# Patient Record
Sex: Male | Born: 1990 | Race: White | Hispanic: No | Marital: Single | Smoking: Never smoker
Health system: Southern US, Community
[De-identification: ages and names within clinical notes are randomized; demographics above are authoritative.]

## PROBLEM LIST (undated history)

## (undated) DIAGNOSIS — R7989 Other specified abnormal findings of blood chemistry: Secondary | ICD-10-CM

## (undated) DIAGNOSIS — F909 Attention-deficit hyperactivity disorder, unspecified type: Secondary | ICD-10-CM

## (undated) HISTORY — DX: Attention-deficit hyperactivity disorder, unspecified type: F90.9

## (undated) HISTORY — DX: Other specified abnormal findings of blood chemistry: R79.89

---
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
## 2010-06-03 ENCOUNTER — Emergency Department: Payer: Self-pay | Admitting: Emergency Medicine

## 2021-11-13 LAB — HM HEPATITIS C SCREENING LAB: HM Hepatitis Screen: NEGATIVE

## 2021-11-13 LAB — HM HIV SCREENING LAB: HM HIV Screening: NEGATIVE

## 2022-10-22 ENCOUNTER — Encounter: Payer: Self-pay | Admitting: Urology

## 2022-10-22 ENCOUNTER — Ambulatory Visit (INDEPENDENT_AMBULATORY_CARE_PROVIDER_SITE_OTHER): Payer: PRIVATE HEALTH INSURANCE | Admitting: Urology

## 2022-10-22 VITALS — BP 155/82 | HR 120 | Ht 69.0 in | Wt 228.0 lb

## 2022-10-22 DIAGNOSIS — E291 Testicular hypofunction: Secondary | ICD-10-CM

## 2022-10-22 MED ORDER — TESTOSTERONE ENANTHATE 200 MG/ML IM SOLN: 100.0000 mg | INTRAMUSCULAR | 4 refills | Status: DC

## 2022-10-22 NOTE — Patient Instructions (Signed)

## 2022-10-22 NOTE — Progress Notes (Signed)
   10/22/22 8:26 AM   Jalene D Freeze 1991/05/22 [id number]  CC: Hypogonadism, ED, transferred care from Dr. Eliberto Ivory  HPI: 32 year old male transferring his care from Dr. Eliberto Ivory for low testosterone.  He was started on testosterone about a year and a half ago by Dr. Eliberto Ivory.  Original testosterone was low at 223, and he had symptoms of fatigue, daytime sleepiness, low libido, and ED.  He has had significant improvement in the symptoms since starting testosterone injections, and most recent testosterone was 738 1 day after injection was given.  He is currently taking an injection weekly.  He does feel a little bit more rundown towards the end of the week.  He would like to continue testosterone injections.  He does not have any children, and does not desire any children in the future.  Physical Exam: BP (!) 155/82   Pulse (!) 120   Ht 5' 9"$  (1.753 m)   Wt 228 lb (103.4 kg)   BMI 33.67 kg/m    Constitutional:  Alert and oriented, No acute distress. Cardiovascular: No clubbing, cyanosis, or edema. Respiratory: Normal respiratory effort, no increased work of breathing. GI: Abdomen is soft, nontender, nondistended, no abdominal masses  Assessment & Plan:   32 year old male with history of low testosterone currently on weekly injections with significant symptom relief, normal testosterone levels.  I reviewed the outside records from Dr. Eliberto Ivory extensively, PSA, H/H, testosterone levels have been normal.  We reviewed the AUA guidelines regarding evaluation and management of patients with low testosterone, as well as the impact on fertility, and reviewed alternative treatment options.  He would like to continue testosterone injections.  Testosterone injections refilled RTC 6 months testosterone, H/H(labs should be drawn 3 days after injection)-> if labs stable can likely move to yearly follow-up with labs every 6 months  Nickolas Madrid, MD 10/22/2022  [facility] [address], [place], [place] [address] [phone]

## 2023-04-18 ENCOUNTER — Other Ambulatory Visit
Admission: RE | Admit: 2023-04-18 | Discharge: 2023-04-18 | Disposition: A | Discharge status: Home / Self Care | Payer: PRIVATE HEALTH INSURANCE | Attending: Urology | Admitting: Urology

## 2023-04-18 DIAGNOSIS — E291 Testicular hypofunction: Secondary | ICD-10-CM | POA: Insufficient documentation

## 2023-04-18 LAB — HEMOGLOBIN AND HEMATOCRIT, BLOOD
HCT: 49 % (ref 39.0–52.0)
Hemoglobin: 17.7 g/dL — ABNORMAL HIGH (ref 13.0–17.0)

## 2023-04-20 LAB — TESTOSTERONE: Testosterone: 1136 ng/dL — ABNORMAL HIGH (ref 264–916)

## 2023-04-22 ENCOUNTER — Other Ambulatory Visit: Payer: Self-pay | Admitting: Urology

## 2023-04-22 ENCOUNTER — Ambulatory Visit (INDEPENDENT_AMBULATORY_CARE_PROVIDER_SITE_OTHER): Payer: PRIVATE HEALTH INSURANCE | Admitting: Urology

## 2023-04-22 ENCOUNTER — Encounter: Payer: Self-pay | Admitting: Urology

## 2023-04-22 VITALS — Ht 69.0 in | Wt 236.0 lb

## 2023-04-22 DIAGNOSIS — E291 Testicular hypofunction: Secondary | ICD-10-CM

## 2023-04-22 MED ORDER — TESTOSTERONE ENANTHATE 200 MG/ML IM SOLN: 100.0000 mg | INTRAMUSCULAR | 2 refills | Status: DC

## 2023-04-22 NOTE — Progress Notes (Signed)
   04/22/2023 10:09 AM   Jared [name] 08-25-1991 [id number]  Reason for visit: Follow up hypogonadism, ED  HPI: Healthy 32 year old male who transferred his care and February 2024 from Dr. Evelene Croon.  He has been on testosterone since 2022 with injections.  Original testosterone was 223, and symptoms were fatigue, daytime sleepiness, low libido, and ED.  He has had significant improvement on injections 100 mg weekly.  He has no children and does not desire any biologic pregnancies.  He denies any problems since our last visit.  Recent testosterone 04/18/2023 was 1136, however this was checked the morning after an injection.  I recommended checking his levels 3 to 4 days after an injection in the future.  Hematocrit normal at 49.  We reviewed the AUA guidelines regarding the risks and benefits of testosterone therapy, I recommended continuing the 100 mg weekly dose, will continue to monitor lab work every 6 months, and see him in person yearly for follow-up.  Lab visit 6 months with testosterone, H/H(labs should be drawn 3 to 4 days after injection) Testosterone refilled If labs stable, can continue lab work every 6 months, with PA follow-up yearly     [facility] [address], [address] [place], [place] [address] [phone]

## 2023-05-30 ENCOUNTER — Ambulatory Visit: Payer: PRIVATE HEALTH INSURANCE | Admitting: Physician Assistant

## 2023-07-15 ENCOUNTER — Ambulatory Visit (INDEPENDENT_AMBULATORY_CARE_PROVIDER_SITE_OTHER): Payer: PRIVATE HEALTH INSURANCE | Admitting: Physician Assistant

## 2023-07-15 ENCOUNTER — Encounter: Payer: Self-pay | Admitting: Physician Assistant

## 2023-07-15 VITALS — BP 120/80 | HR 86 | Temp 98.3°F | Ht 69.0 in | Wt 231.0 lb

## 2023-07-15 DIAGNOSIS — J069 Acute upper respiratory infection, unspecified: Secondary | ICD-10-CM

## 2023-07-15 DIAGNOSIS — Z6834 Body mass index (BMI) 34.0-34.9, adult: Secondary | ICD-10-CM | POA: Diagnosis not present

## 2023-07-15 DIAGNOSIS — E66812 Obesity, class 2: Secondary | ICD-10-CM | POA: Insufficient documentation

## 2023-07-15 DIAGNOSIS — F909 Attention-deficit hyperactivity disorder, unspecified type: Secondary | ICD-10-CM | POA: Diagnosis not present

## 2023-07-15 DIAGNOSIS — E291 Testicular hypofunction: Secondary | ICD-10-CM | POA: Insufficient documentation

## 2023-07-15 DIAGNOSIS — E785 Hyperlipidemia, unspecified: Secondary | ICD-10-CM | POA: Insufficient documentation

## 2023-07-15 DIAGNOSIS — Z862 Personal history of diseases of the blood and blood-forming organs and certain disorders involving the immune mechanism: Secondary | ICD-10-CM | POA: Insufficient documentation

## 2023-07-15 MED ORDER — AMPHETAMINE-DEXTROAMPHETAMINE 10 MG PO TABS
10.0000 mg | ORAL_TABLET | Freq: Two times a day (BID) | ORAL | 0 refills | Status: DC
Start: 2023-07-15 — End: 2023-08-13

## 2023-07-15 NOTE — Progress Notes (Addendum)
 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  He brings me records from Dr. Patrecia Pace with last visit 04/04/2023.  Also mentions URI symptoms for the last 1-2 days including cough and fever.  Does not particularly wish to be evaluated for this problem, figures it is likely viral.  Endorses ADHD which is substantiated by previous records.  Intolerant to [employer], but Adderall works well.  Previous dose was 10 mg twice daily and patient requesting refill at this dose.  Mentions excess weight, attributes this to his strong appetite.  He is carefully followed by urologist Dr. Richardo Hanks for TRT.  Last testosterone 04/18/2023 was 1136, but this was very soon after his injection.  Very mildly elevated hemoglobin at 17.7.  Medication list has been reviewed and updated.  Current Meds  Medication Sig   amphetamine-dextroamphetamine (ADDERALL) 10 MG tablet Take 1 tablet (10 mg total) by mouth 2 (two) times daily.   testosterone enanthate (DELATESTRYL) 200 MG/ML injection Inject 0.5 mLs (100 mg total) into the muscle once a week.     Review of Systems  Constitutional:  Positive for diaphoresis and fever.  Respiratory:  Positive for cough.     Patient Active Problem List   Diagnosis Date Noted   Attention deficit hyperactivity disorder (ADHD) 07/15/2023   Class 2 obesity 07/15/2023   History of iron deficiency anemia 07/15/2023   Hyperlipidemia 07/15/2023    Allergies  Allergen Reactions   Strattera [Atomoxetine] Rash    Immunization History  Administered Date(s) Administered   DTaP 09/25/1990, 01/20/1991, 03/29/1991, 01/05/1992, 01/16/1996   HIB,  Unspecified 09/25/1990, 01/20/1991, 03/29/1991, 10/08/1991   Hepatitis A, Adult 05/12/2008, 06/09/2008   Hepatitis B, ADULT 06/15/2002, 07/20/2002, 12/31/2002   IPV 09/25/1990, 01/20/1991, 01/05/1992, 01/16/1996   Influenza, Seasonal, Injecte, Preservative Fre 06/09/2008   Influenza,inj,Quad PF,6+ Mos 06/12/2023   MMR 10/08/1991, 01/16/1996   Meningococcal Conjugate 12/31/2007   Tdap 05/12/2008    History reviewed. No pertinent surgical history.  Social History   Tobacco Use   Smoking status: Never    Passive exposure: Never   Smokeless tobacco: Never  Vaping Use   Vaping status: Never Used  Substance Use Topics   Alcohol use: Yes    Comment: socially   Drug use: Never    Family History  Problem Relation Age of Onset   Hypertension Mother    Hypertension Father         07/15/2023   10:20 AM  GAD 7 : Generalized Anxiety Score  Nervous, Anxious, on Edge 1  Control/stop worrying 2  Worry too much - different things 1  Trouble relaxing 0  Restless 2  Easily annoyed or irritable 0  Afraid - awful might happen 0  Total GAD 7 Score 6  Anxiety Difficulty Not difficult at all       07/15/2023   10:20 AM  Depression screen PHQ 2/9  Decreased Interest 3  Down, Depressed, Hopeless 1  PHQ - 2 Score 4  Altered sleeping 0  Tired, decreased energy 0  Change in appetite 3  Feeling bad or failure about yourself  2  Trouble concentrating 3  Moving slowly or fidgety/restless 0  Suicidal thoughts 0  PHQ-9 Score 12  Difficult doing work/chores Somewhat difficult    BP Readings from Last 3 Encounters:  07/15/23 120/80  10/22/22 (!) 155/82    Wt Readings from Last 3 Encounters:  07/15/23 231 lb (104.8 kg)  04/22/23 236 lb (107 kg)  10/22/22 228 lb (103.4 kg)    BP 120/80 (BP Location: Left Arm, Patient Position: Sitting, Cuff Size: Large)   Pulse 86   Temp 98.3 F (36.8 C) (Oral)   Ht 5\' 9"  (1.753 m)   Wt 231 lb (104.8 kg)   SpO2 97%   BMI 34.11 kg/m    Physical Exam Vitals and nursing note reviewed.  Constitutional:      Appearance: Normal appearance. He is diaphoretic.  Cardiovascular:     Rate and Rhythm: Normal rate and regular rhythm.     Heart sounds: No murmur heard.    No friction rub. No gallop.  Pulmonary:     Effort: Pulmonary effort is normal.     Breath sounds: Normal breath sounds.  Abdominal:     General: There is no distension.  Musculoskeletal:        General: Normal range of motion.  Skin:    General: Skin is warm.  Neurological:     Mental Status: He is alert and oriented to person, place, and time.     Gait: Gait is intact.  Psychiatric:        Mood and Affect: Mood and affect normal.     Recent Labs  No results found for: "NA", "K", "CL", "CO2", "GLUCOSE", "BUN", "CREATININE", "CALCIUM", "PROT", "ALBUMIN", "AST", "ALT", "ALKPHOS", "BILITOT", "GFRNONAA", "GFRAA"  Lab Results  Component Value Date   HGB 17.7 (H) 04/18/2023   HCT 49.0 04/18/2023   No results found for: "HGBA1C" No results found for: "CHOL", "HDL", "LDLCALC", "LDLDIRECT", "TRIG", "CHOLHDL" No results found for: "TSH"   Assessment and Plan:  1. Attention deficit hyperactivity disorder (ADHD), unspecified ADHD type Refill Adderall as below.  Advised to watch for tachycardia, palpitations, chest pain, insomnia. - amphetamine-dextroamphetamine (ADDERALL) 10 MG tablet; Take 1 tablet (10 mg total) by mouth 2 (two) times daily.  Dispense: 60 tablet; Refill: 0  2. Acute URI Likely viral etiology. Discussed self-limited nature of viral illnesses and advised conservative measures including rest, fluids, honey, and OTC cough/cold medications. Reviewed anecdotal evidence for zinc lozenges to reduce viral replication when used at the onset of symptoms.   Contact precautions advised to limit spread. Encouraged mask wearing and good hand hygiene especially before meals. Call if acutely worsening symptoms or if no improvement after Day 7 of  illness  3. Class 2 obesity Only briefly discussed today.  Use of Adderall might provide him with some appetite suppression and produce modest weight loss.  Will follow clinically.     Return in about 3 months (around 10/15/2023) for CPE, adderall, tdap.    Alvester Morin, PA-C, DMSc, Nutritionist [facility] [phone]

## 2023-07-15 NOTE — Patient Instructions (Signed)
-

## 2023-08-08 DIAGNOSIS — E291 Testicular hypofunction: Secondary | ICD-10-CM

## 2023-08-08 MED ORDER — "BD LUER-LOK SYRINGE 23G X 1-1/2"" 3 ML MISC": 0 refills | Status: DC

## 2023-08-08 MED ORDER — "BD SAFETYGLIDE NEEDLE 18G X 1-1/2"" MISC": 0 refills | Status: DC

## 2023-08-13 ENCOUNTER — Other Ambulatory Visit: Payer: Self-pay | Admitting: Physician Assistant

## 2023-08-13 DIAGNOSIS — F909 Attention-deficit hyperactivity disorder, unspecified type: Secondary | ICD-10-CM

## 2023-08-13 MED ORDER — AMPHETAMINE-DEXTROAMPHETAMINE 10 MG PO TABS: 10.0000 mg | ORAL_TABLET | Freq: Two times a day (BID) | ORAL | 0 refills | Status: DC

## 2023-09-15 ENCOUNTER — Other Ambulatory Visit: Payer: Self-pay | Admitting: Physician Assistant

## 2023-09-15 DIAGNOSIS — F909 Attention-deficit hyperactivity disorder, unspecified type: Secondary | ICD-10-CM

## 2023-09-15 MED ORDER — AMPHETAMINE-DEXTROAMPHETAMINE 10 MG PO TABS: 10.0000 mg | ORAL_TABLET | Freq: Two times a day (BID) | ORAL | 0 refills | Status: DC

## 2023-10-15 ENCOUNTER — Other Ambulatory Visit: Payer: Self-pay | Admitting: Physician Assistant

## 2023-10-15 DIAGNOSIS — F909 Attention-deficit hyperactivity disorder, unspecified type: Secondary | ICD-10-CM

## 2023-10-15 MED ORDER — AMPHETAMINE-DEXTROAMPHETAMINE 10 MG PO TABS: 10.0000 mg | ORAL_TABLET | Freq: Two times a day (BID) | ORAL | 0 refills | Status: DC

## 2023-10-16 ENCOUNTER — Encounter: Payer: PRIVATE HEALTH INSURANCE | Admitting: Family Medicine

## 2023-10-16 ENCOUNTER — Encounter: Payer: Self-pay | Admitting: Physician Assistant

## 2023-10-20 ENCOUNTER — Encounter: Payer: PRIVATE HEALTH INSURANCE | Admitting: Physician Assistant

## 2023-10-31 ENCOUNTER — Ambulatory Visit (INDEPENDENT_AMBULATORY_CARE_PROVIDER_SITE_OTHER): Payer: PRIVATE HEALTH INSURANCE | Admitting: Physician Assistant

## 2023-10-31 ENCOUNTER — Encounter: Payer: Self-pay | Admitting: Physician Assistant

## 2023-10-31 VITALS — BP 114/78 | HR 94 | Temp 98.2°F | Ht 69.0 in | Wt 226.0 lb

## 2023-10-31 DIAGNOSIS — Z0000 Encounter for general adult medical examination without abnormal findings: Secondary | ICD-10-CM

## 2023-10-31 DIAGNOSIS — Z79899 Other long term (current) drug therapy: Secondary | ICD-10-CM | POA: Diagnosis not present

## 2023-10-31 DIAGNOSIS — I861 Scrotal varices: Secondary | ICD-10-CM

## 2023-10-31 DIAGNOSIS — E291 Testicular hypofunction: Secondary | ICD-10-CM

## 2023-10-31 DIAGNOSIS — E785 Hyperlipidemia, unspecified: Secondary | ICD-10-CM

## 2023-10-31 DIAGNOSIS — Z23 Encounter for immunization: Secondary | ICD-10-CM

## 2023-10-31 DIAGNOSIS — F909 Attention-deficit hyperactivity disorder, unspecified type: Secondary | ICD-10-CM

## 2023-10-31 NOTE — Progress Notes (Signed)
 Date:  10/31/2023   Name:  Jared [name]   DOB:  06/15/1991   MRN:  [id number]   Chief Complaint: Annual Exam  HPI Jared [name] presents today for routine physical exam.  Last Physical: >5y ago Last Dental Exam: 34m ago Last Eye Exam: 1y ago Immunizations Due: Tdap  Thinks he might have OSA for about 1 year. Snores loudly, witnessed apneic events per fiancee.  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   Medication list has been reviewed and updated.  Current Meds  Medication Sig   amphetamine-dextroamphetamine (ADDERALL) 10 MG tablet Take 1 tablet (10 mg total) by mouth 2 (two) times daily.   NEEDLE, DISP, 18 G (BD SAFETYGLIDE NEEDLE) 18G X 1-1/2" MISC Use as directed   SYRINGE-NEEDLE, DISP, 3 ML (B-D 3CC LUER-LOK SYR 23GX1-1/2) 23G X 1-1/2" 3 ML MISC Use as directed   testosterone enanthate (DELATESTRYL) 200 MG/ML injection Inject 0.5 mLs (100 mg total) into the muscle once a week.     Review of Systems  Patient Active Problem List   Diagnosis Date Noted   Long-term current use of stimulant 10/31/2023   Varicocele 10/31/2023   Attention deficit hyperactivity disorder (ADHD) 07/15/2023   Class 2 obesity 07/15/2023   History of iron deficiency anemia 07/15/2023   Hyperlipidemia 07/15/2023   Hypogonadism in male 07/15/2023    Allergies  Allergen Reactions   Strattera [Atomoxetine] Rash    Immunization History  Administered Date(s) Administered   DTaP 09/25/1990, 01/20/1991, 03/29/1991, 01/05/1992, 01/16/1996   HIB, Unspecified 09/25/1990, 01/20/1991, 03/29/1991, 10/08/1991   Hepatitis A, Adult 05/12/2008, 06/09/2008   Hepatitis B, ADULT 06/15/2002, 07/20/2002, 12/31/2002   IPV 09/25/1990, 01/20/1991, 01/05/1992, 01/16/1996   Influenza, Seasonal, Injecte, Preservative Fre 06/09/2008   Influenza,inj,Quad PF,6+ Mos 06/12/2023   MMR 10/08/1991, 01/16/1996    Meningococcal Conjugate 12/31/2007   Tdap 05/12/2008, 10/31/2023    History reviewed. No pertinent surgical history.  Social History   Tobacco Use   Smoking status: Never    Passive exposure: Never   Smokeless tobacco: Never  Vaping Use   Vaping status: Never Used  Substance Use Topics   Alcohol use: Yes    Comment: socially   Drug use: Never    Family History  Problem Relation Age of Onset   Hypertension Mother    Hypertension Father         07/15/2023   10:20 AM  GAD 7 : Generalized Anxiety Score  Nervous, Anxious, on Edge 1  Control/stop worrying 2  Worry too much - different things 1  Trouble relaxing 0  Restless 2  Easily annoyed or irritable 0  Afraid - awful might happen 0  Total GAD 7 Score 6  Anxiety Difficulty Not difficult at all       07/15/2023   10:20 AM  Depression screen PHQ 2/9  Decreased Interest 3  Down, Depressed, Hopeless 1  PHQ - 2 Score 4  Altered sleeping 0  Tired, decreased energy 0  Change in appetite 3  Feeling bad or failure about yourself  2  Trouble concentrating 3  Moving slowly or fidgety/restless 0  Suicidal thoughts 0  PHQ-9 Score 12  Difficult doing work/chores Somewhat difficult    BP Readings from Last 3 Encounters:  10/31/23 114/78  07/15/23 120/80  10/22/22 (!) 155/82    Wt Readings from Last 3 Encounters:  10/31/23 226  lb (102.5 kg)  07/15/23 231 lb (104.8 kg)  04/22/23 236 lb (107 kg)    BP 114/78   Pulse 94   Temp 98.2 F (36.8 C)   Ht 5\' 9"  (1.753 m)   Wt 226 lb (102.5 kg)   SpO2 96%   BMI 33.37 kg/m   Physical Exam Vitals and nursing note reviewed.  Constitutional:      Appearance: Normal appearance.  HENT:     Ears:     Comments: EAC clear bilaterally with good view of TM which is without effusion or erythema.     Nose: Nose normal.     Mouth/Throat:     Mouth: Mucous membranes are moist. No oral lesions.     Dentition: Normal dentition.     Pharynx: No posterior oropharyngeal  erythema.  Eyes:     Extraocular Movements: Extraocular movements intact.     Conjunctiva/sclera: Conjunctivae normal.     Pupils: Pupils are equal, round, and reactive to light.  Neck:     Thyroid: No thyromegaly.  Cardiovascular:     Rate and Rhythm: Normal rate and regular rhythm.     Heart sounds: No murmur heard.    No friction rub. No gallop.     Comments: Pulses 2+ at radial, PT, DP bilaterally. No carotid bruit. No peripheral edema Pulmonary:     Effort: Pulmonary effort is normal.     Breath sounds: Normal breath sounds.  Abdominal:     General: Bowel sounds are normal.     Palpations: Abdomen is soft. There is no mass.     Tenderness: There is no abdominal tenderness.  Genitourinary:    Comments: Typical male genitalia with varicocele noted, probably bilateral.  There also appears to be some degree of testicular atrophy. Reviewed technique for testicular self-exam at home.  Musculoskeletal:     Comments: Full ROM with strength 5/5 bilateral upper and lower extremities  Lymphadenopathy:     Cervical: No cervical adenopathy.  Skin:    General: Skin is warm.     Capillary Refill: Capillary refill takes less than 2 seconds.     Findings: No lesion or rash.  Neurological:     Mental Status: He is alert and oriented to person, place, and time.     Gait: Gait is intact.  Psychiatric:        Mood and Affect: Mood normal.        Behavior: Behavior normal.     Recent Labs  No results found for: "NA", "K", "CL", "CO2", "GLUCOSE", "BUN", "CREATININE", "CALCIUM", "PROT", "ALBUMIN", "AST", "ALT", "ALKPHOS", "BILITOT", "GFRNONAA", "GFRAA"  Lab Results  Component Value Date   HGB 17.7 (H) 04/18/2023   HCT 49.0 04/18/2023   No results found for: "HGBA1C" No results found for: "CHOL", "HDL", "LDLCALC", "LDLDIRECT", "TRIG", "CHOLHDL" No results found for: "TSH"   Assessment and Plan:  1. Annual physical exam (Primary) Seemingly healthy patient with no abnormalities on  exam. Encouraged healthy lifestyle including regular physical activity and consumption of whole fruits and vegetables. Encouraged routine dental and eye exams. Vaccinations up to date.  Ordering routine labs today.  Patient also has some outstanding labs ordered by Dr. Richardo Hanks including testosterone; for this reason, he will refer return at a later date for fasting early morning labs.  - Lipid panel - CBC with Differential/Platelet - Comprehensive metabolic panel - TSH  2. Immunization due Tdap booster administered today - Tdap vaccine greater than or equal to 7yo IM  3.  Attention deficit hyperactivity disorder (ADHD), unspecified ADHD type Due for UDS.  Patient to request refill for Adderall in 7-10d - Drug Screen, Urine  4. Long-term current use of stimulant - Drug Screen, Urine  5. Hypogonadism in male Plan to check testosterone soon, per Dr. Sonia Side orders.  Continue IM testosterone 100 mg weekly as prescribed.  6. Varicocele Asymptomatic, incidental finding on today's exam.  We reviewed signs and symptoms of symptomatic varicocele.  Question if this is the cause of his hypogonadism.  Probably some degree of testicular atrophy as well, but again cannot be sure if this is caused by varicocele or exogenous testosterone therapy.  He has no interest in fertility.  Patient has upcoming visit with urology, encouraged he seek their guidance as well.  7. Hyperlipidemia, unspecified hyperlipidemia type Will check fasting lipids when he returns for labs. - Lipid panel     Return in about 3 months (around 01/31/2024) for OV MyChart f/u chronic conditions.    Alvester Morin, PA-C, DMSc, Nutritionist [facility] [phone]

## 2023-11-02 LAB — DRUG SCREEN, URINE
Amphetamines, Urine: NEGATIVE ng/mL
Barbiturate screen, urine: NEGATIVE ng/mL
Benzodiazepine Quant, Ur: NEGATIVE ng/mL
Cannabinoid Quant, Ur: NEGATIVE ng/mL
Cocaine (Metab.): NEGATIVE ng/mL
Opiate Quant, Ur: NEGATIVE ng/mL
PCP Quant, Ur: NEGATIVE ng/mL

## 2023-11-17 ENCOUNTER — Other Ambulatory Visit
Admission: RE | Admit: 2023-11-17 | Discharge: 2023-11-17 | Disposition: A | Discharge status: Home / Self Care | Payer: PRIVATE HEALTH INSURANCE | Attending: Urology | Admitting: Urology

## 2023-11-17 ENCOUNTER — Other Ambulatory Visit: Payer: Self-pay | Admitting: Physician Assistant

## 2023-11-17 ENCOUNTER — Telehealth: Payer: Self-pay | Admitting: Urology

## 2023-11-17 DIAGNOSIS — F909 Attention-deficit hyperactivity disorder, unspecified type: Secondary | ICD-10-CM

## 2023-11-17 DIAGNOSIS — E291 Testicular hypofunction: Secondary | ICD-10-CM | POA: Insufficient documentation

## 2023-11-17 LAB — HEMOGLOBIN AND HEMATOCRIT, BLOOD
HCT: 50.4 % (ref 39.0–52.0)
Hemoglobin: 18.4 g/dL — ABNORMAL HIGH (ref 13.0–17.0)

## 2023-11-17 MED ORDER — AMPHETAMINE-DEXTROAMPHETAMINE 10 MG PO TABS: 10.0000 mg | ORAL_TABLET | Freq: Two times a day (BID) | ORAL | 0 refills | Status: DC

## 2023-11-17 NOTE — Telephone Encounter (Signed)
 Pt had labs drawn today in [place].    Lab visit 6 months with testosterone, H/H(labs should be drawn 3 to 4 days after injection) Testosterone refilled If labs stable, can continue lab work every 6 months, with PA follow-up yearly  This was last message from [facility] 03/2023.  I told pt we would call with lab results and whether he needed a yearly follow up w/PA.

## 2023-11-18 ENCOUNTER — Other Ambulatory Visit: Payer: Self-pay

## 2023-11-18 DIAGNOSIS — E291 Testicular hypofunction: Secondary | ICD-10-CM

## 2023-11-18 LAB — LIPID PANEL
Chol/HDL Ratio: 5 ratio (ref 0.0–5.0)
Cholesterol, Total: 170 mg/dL (ref 100–199)
HDL: 34 mg/dL — ABNORMAL LOW (ref >39)
LDL Chol Calc (NIH): 91 mg/dL (ref 0–99)
Triglycerides: 270 mg/dL — ABNORMAL HIGH (ref 0–149)
VLDL Cholesterol Cal: 45 mg/dL — ABNORMAL HIGH (ref 5–40)

## 2023-11-18 LAB — CBC WITH DIFFERENTIAL/PLATELET
Basophils Absolute: 0 10*3/uL (ref 0.0–0.2)
Basos: 0 %
EOS (ABSOLUTE): 0.2 10*3/uL (ref 0.0–0.4)
Eos: 2 %
Hematocrit: 53.3 % — ABNORMAL HIGH (ref 37.5–51.0)
Hemoglobin: 18.2 g/dL — ABNORMAL HIGH (ref 13.0–17.7)
Immature Grans (Abs): 0 10*3/uL (ref 0.0–0.1)
Immature Granulocytes: 0 %
Lymphocytes Absolute: 2.5 10*3/uL (ref 0.7–3.1)
Lymphs: 29 %
MCH: 31.2 pg (ref 26.6–33.0)
MCHC: 34.1 g/dL (ref 31.5–35.7)
MCV: 91 fL (ref 79–97)
Monocytes Absolute: 0.7 10*3/uL (ref 0.1–0.9)
Monocytes: 8 %
Neutrophils Absolute: 5.1 10*3/uL (ref 1.4–7.0)
Neutrophils: 61 %
Platelets: 239 10*3/uL (ref 150–450)
RBC: 5.83 x10E6/uL — ABNORMAL HIGH (ref 4.14–5.80)
RDW: 13 % (ref 11.6–15.4)
WBC: 8.4 10*3/uL (ref 3.4–10.8)

## 2023-11-18 LAB — COMPREHENSIVE METABOLIC PANEL
ALT: 51 IU/L — ABNORMAL HIGH (ref 0–44)
AST: 33 IU/L (ref 0–40)
Albumin: 4.7 g/dL (ref 4.1–5.1)
Alkaline Phosphatase: 60 IU/L (ref 44–121)
BUN/Creatinine Ratio: 8 — ABNORMAL LOW (ref 9–20)
BUN: 9 mg/dL (ref 6–20)
Bilirubin Total: 0.6 mg/dL (ref 0.0–1.2)
CO2: 21 mmol/L (ref 20–29)
Calcium: 9.6 mg/dL (ref 8.7–10.2)
Chloride: 101 mmol/L (ref 96–106)
Creatinine, Ser: 1.09 mg/dL (ref 0.76–1.27)
Globulin, Total: 2.2 g/dL (ref 1.5–4.5)
Glucose: 74 mg/dL (ref 70–99)
Potassium: 4.3 mmol/L (ref 3.5–5.2)
Sodium: 140 mmol/L (ref 134–144)
Total Protein: 6.9 g/dL (ref 6.0–8.5)
eGFR: 92 mL/min/{1.73_m2} (ref >59)

## 2023-11-18 LAB — TESTOSTERONE: Testosterone: 376 ng/dL (ref 264–916)

## 2023-11-18 LAB — TSH: TSH: 1.34 u[IU]/mL (ref 0.450–4.500)

## 2023-11-18 MED ORDER — TESTOSTERONE ENANTHATE 200 MG/ML IM SOLN: 100.0000 mg | INTRAMUSCULAR | 2 refills | Status: AC

## 2023-12-18 ENCOUNTER — Other Ambulatory Visit: Payer: Self-pay | Admitting: Physician Assistant

## 2023-12-18 DIAGNOSIS — F909 Attention-deficit hyperactivity disorder, unspecified type: Secondary | ICD-10-CM

## 2023-12-18 MED ORDER — AMPHETAMINE-DEXTROAMPHETAMINE 10 MG PO TABS: 10.0000 mg | ORAL_TABLET | Freq: Two times a day (BID) | ORAL | 0 refills | Status: DC

## 2023-12-22 NOTE — Telephone Encounter (Signed)
 Please review. I don't see a referral.  KP

## 2024-01-08 DIAGNOSIS — E291 Testicular hypofunction: Secondary | ICD-10-CM

## 2024-01-08 MED ORDER — BD LUER-LOK SYRINGE 23G X 1-1/2" 3 ML MISC: 0 refills | Status: AC

## 2024-01-08 MED ORDER — BD SAFETYGLIDE NEEDLE 18G X 1-1/2" MISC: 0 refills | Status: AC

## 2024-01-16 ENCOUNTER — Other Ambulatory Visit: Payer: Self-pay | Admitting: Physician Assistant

## 2024-01-16 DIAGNOSIS — F909 Attention-deficit hyperactivity disorder, unspecified type: Secondary | ICD-10-CM

## 2024-01-16 MED ORDER — AMPHETAMINE-DEXTROAMPHETAMINE 10 MG PO TABS: 10.0000 mg | ORAL_TABLET | Freq: Two times a day (BID) | ORAL | 0 refills | Status: DC

## 2024-01-16 NOTE — Telephone Encounter (Signed)
 Please review and advise if you will send in the amphetamine -dextroamphetamine .  JM

## 2024-02-02 ENCOUNTER — Encounter: Payer: Self-pay | Admitting: Physician Assistant

## 2024-02-02 ENCOUNTER — Telehealth (INDEPENDENT_AMBULATORY_CARE_PROVIDER_SITE_OTHER): Payer: PRIVATE HEALTH INSURANCE | Admitting: Physician Assistant

## 2024-02-02 VITALS — Ht 69.0 in

## 2024-02-02 DIAGNOSIS — R29818 Other symptoms and signs involving the nervous system: Secondary | ICD-10-CM | POA: Insufficient documentation

## 2024-02-02 DIAGNOSIS — R0683 Snoring: Secondary | ICD-10-CM | POA: Insufficient documentation

## 2024-02-02 DIAGNOSIS — G4733 Obstructive sleep apnea (adult) (pediatric): Secondary | ICD-10-CM | POA: Insufficient documentation

## 2024-02-02 DIAGNOSIS — Z79899 Other long term (current) drug therapy: Secondary | ICD-10-CM

## 2024-02-02 DIAGNOSIS — F909 Attention-deficit hyperactivity disorder, unspecified type: Secondary | ICD-10-CM | POA: Diagnosis not present

## 2024-02-02 NOTE — Assessment & Plan Note (Signed)
 Patient encouraged to use [employer] or similar app as a free means to screen for apnea while awaiting insurance stability.

## 2024-02-02 NOTE — Assessment & Plan Note (Signed)
 Doing well with Adderall, continue at the current dose, just refilled 01/16/2024.  Follow-up in 3 months in person.

## 2024-02-02 NOTE — Progress Notes (Signed)
 Date:  02/02/2024   Name:  Jared [name]   DOB:  03-22-1991   MRN:  [id number]   I connected with Beauty Bourbon on 02/02/24 via MyChart Video and verified that I am speaking with the correct person using appropriate identifiers. The limitations, risks, security and privacy concerns of performing an evaluation and management service by MyChart Video, including the higher likelihood of inaccurate diagnoses and treatments, and the availability of in person appointments were reviewed. The possible need of an additional face-to-face encounter for complete and high quality delivery of care was discussed. The patient was also made aware that there may be [name] patient responsible charge related to this service. The patient expressed understanding and wishes to proceed.   Provider location is in medical facility [facility] at [facility]). Patient location is at their home People involved in care of the patient during this telehealth encounter were myself, my CMA, and my front office/scheduling team member.    Chief Complaint: Medical Management of Chronic Issues  HPI Jared [name] presents virtually today for 58-month follow-up on chronic conditions, namely ADHD for which he is using Adderall with good efficacy, tolerance, and compliance.    Just over [name] month ago he was referred to [facility] for sleep study but was out of network.  He is presently between jobs/insurance, but once this resolves he plans to pursue sleep study.  He is on testosterone  replacement therapy with Dr. Estanislao Heimlich, also has varicocele noted on last physical, plans to bring this up with urology next visit with them.    Medication list has been reviewed and updated.  Current Meds  Medication Sig   amphetamine -dextroamphetamine  (ADDERALL) 10 MG tablet Take 1 tablet (10 mg total) by mouth 2 (two) times daily.   NEEDLE, DISP, 18 G (BD SAFETYGLIDE NEEDLE) 18G X 1-1/2" MISC Use as directed    SYRINGE-NEEDLE, DISP, 3 ML (B-D 3CC LUER-LOK SYR 23GX1-1/2) 23G X 1-1/2" 3 ML MISC Use as directed   testosterone  enanthate (DELATESTRYL ) 200 MG/ML injection Inject 0.5 mLs (100 mg total) into the muscle once [name] week.     Review of Systems  Patient Active Problem List   Diagnosis Date Noted   Snoring 02/02/2024   Long-term current use of stimulant 10/31/2023   Varicocele 10/31/2023   Attention deficit hyperactivity disorder (ADHD) 07/15/2023   Class 2 obesity 07/15/2023   History of iron deficiency anemia 07/15/2023   Hyperlipidemia 07/15/2023   Hypogonadism in male 07/15/2023    Allergies  Allergen Reactions   Strattera [Atomoxetine] Rash    Immunization History  Administered Date(s) Administered   DTaP 09/25/1990, 01/20/1991, 03/29/1991, 01/05/1992, 01/16/1996   HIB, Unspecified 09/25/1990, 01/20/1991, 03/29/1991, 10/08/1991   Hepatitis [name], Adult 05/12/2008, 06/09/2008   Hepatitis B, ADULT 06/15/2002, 07/20/2002, 12/31/2002   IPV 09/25/1990, 01/20/1991, 01/05/1992, 01/16/1996   Influenza, Seasonal, Injecte, Preservative Fre 06/09/2008   Influenza,inj,Quad PF,6+ Mos 06/12/2023   MMR 10/08/1991, 01/16/1996   Meningococcal Conjugate 12/31/2007   Tdap 05/12/2008, 10/31/2023    History reviewed. No pertinent surgical history.  Social History   Tobacco Use   Smoking status: Never    Passive exposure: Never   Smokeless tobacco: Never  Vaping Use   Vaping status: Never Used  Substance Use Topics   Alcohol use: Yes    Comment: socially   Drug use: Never    Family History  Problem Relation Age of Onset   Hypertension Mother    Hypertension Father  02/02/2024    8:58 AM 07/15/2023   10:20 AM  GAD 7 : Generalized Anxiety Score  Nervous, Anxious, on Edge 0 1  Control/stop worrying 0 2  Worry too much - different things 0 1  Trouble relaxing 0 0  Restless 0 2  Easily annoyed or irritable 0 0  Afraid - awful might happen 0 0  Total GAD 7 Score 0 6   Anxiety Difficulty Not difficult at all Not difficult at all       02/02/2024    8:57 AM 07/15/2023   10:20 AM  Depression screen PHQ 2/9  Decreased Interest 0 3  Down, Depressed, Hopeless 0 1  PHQ - 2 Score 0 4  Altered sleeping  0  Tired, decreased energy  0  Change in appetite  3  Feeling bad or failure about yourself   2  Trouble concentrating  3  Moving slowly or fidgety/restless  0  Suicidal thoughts  0  PHQ-9 Score  12  Difficult doing work/chores  Somewhat difficult    BP Readings from Last 3 Encounters:  10/31/23 114/78  07/15/23 120/80  10/22/22 (!) 155/82    Wt Readings from Last 3 Encounters:  10/31/23 226 lb (102.5 kg)  07/15/23 231 lb (104.8 kg)  04/22/23 236 lb (107 kg)    Ht 5\' 9"  (1.753 m)   BMI 33.37 kg/m   Physical Exam General: Speaking full sentences, no audible heavy breathing. Sounds alert and appropriately interactive. Well-appearing. Face symmetric. Extraocular movements intact. Pupils equal and round. No nasal flaring or accessory muscle use visualized.  Recent Labs     Component Value Date/Time   NA 140 11/17/2023 1018   K 4.3 11/17/2023 1018   CL 101 11/17/2023 1018   CO2 21 11/17/2023 1018   GLUCOSE 74 11/17/2023 1018   BUN 9 11/17/2023 1018   CREATININE 1.09 11/17/2023 1018   CALCIUM 9.6 11/17/2023 1018   PROT 6.9 11/17/2023 1018   ALBUMIN 4.7 11/17/2023 1018   AST 33 11/17/2023 1018   ALT 51 (H) 11/17/2023 1018   ALKPHOS 60 11/17/2023 1018   BILITOT 0.6 11/17/2023 1018    Lab Results  Component Value Date   WBC 8.4 11/17/2023   HGB 18.4 (H) 11/17/2023   HCT 50.4 11/17/2023   MCV 91 11/17/2023   PLT 239 11/17/2023   No results found for: "HGBA1C" Lab Results  Component Value Date   CHOL 170 11/17/2023   HDL 34 (L) 11/17/2023   LDLCALC 91 11/17/2023   TRIG 270 (H) 11/17/2023   CHOLHDL 5.0 11/17/2023   Lab Results  Component Value Date   TSH 1.340 11/17/2023     Assessment and Plan:  Attention deficit  hyperactivity disorder (ADHD), unspecified ADHD type Assessment & Plan: Doing well with Adderall, continue at the current dose, just refilled 01/16/2024.  Follow-up in 3 months in person.   Long-term current use of stimulant Assessment & Plan: Continue Adderall, plan for UDS next time   Snoring Assessment & Plan: Patient encouraged to use [employer] or similar app as [name] free means to screen for apnea while awaiting insurance stability.       Return in about 3 months (around 05/04/2024) for OV f/u ADHD w/ UDS.   I discussed the above assessment and treatment plan with the patient. The patient was provided an opportunity to ask questions and all were answered. The patient agreed with the plan and demonstrated an understanding of the instructions. The patient was advised to  call back or seek an in-person evaluation if the symptoms worsen or if the condition fails to improve as anticipated. I provided [name] total time of 12 minutes inclusive of time utilized for medical chart review, information gathering, care coordination with staff, and documentation completion.  Cody Das, PA-C, DMSc, Nutritionist [facility] [phone]

## 2024-02-02 NOTE — Assessment & Plan Note (Signed)
 Continue Adderall, plan for UDS next time

## 2024-02-16 ENCOUNTER — Other Ambulatory Visit: Payer: Self-pay | Admitting: Physician Assistant

## 2024-02-16 DIAGNOSIS — F909 Attention-deficit hyperactivity disorder, unspecified type: Secondary | ICD-10-CM

## 2024-02-16 MED ORDER — AMPHETAMINE-DEXTROAMPHETAMINE 10 MG PO TABS
10.0000 mg | ORAL_TABLET | Freq: Two times a day (BID) | ORAL | 0 refills | Status: DC
Start: 2024-02-16 — End: 2024-03-17

## 2024-02-16 NOTE — Telephone Encounter (Signed)
 Please review.  KP

## 2024-03-17 ENCOUNTER — Other Ambulatory Visit: Payer: Self-pay | Admitting: Physician Assistant

## 2024-03-17 DIAGNOSIS — F909 Attention-deficit hyperactivity disorder, unspecified type: Secondary | ICD-10-CM

## 2024-03-17 MED ORDER — AMPHETAMINE-DEXTROAMPHETAMINE 10 MG PO TABS: 10.0000 mg | ORAL_TABLET | Freq: Two times a day (BID) | ORAL | 0 refills | Status: DC

## 2024-03-17 NOTE — Telephone Encounter (Signed)
 Please review and refill medication if appropriate.  JM

## 2024-04-15 ENCOUNTER — Other Ambulatory Visit: Payer: Self-pay | Admitting: Physician Assistant

## 2024-04-15 DIAGNOSIS — F909 Attention-deficit hyperactivity disorder, unspecified type: Secondary | ICD-10-CM

## 2024-04-15 MED ORDER — AMPHETAMINE-DEXTROAMPHETAMINE 10 MG PO TABS: 10.0000 mg | ORAL_TABLET | Freq: Two times a day (BID) | ORAL | 0 refills | Status: DC

## 2024-04-15 NOTE — Telephone Encounter (Signed)
 Please review.  KP

## 2024-05-03 ENCOUNTER — Encounter: Payer: Self-pay | Admitting: Physician Assistant

## 2024-05-03 ENCOUNTER — Ambulatory Visit (INDEPENDENT_AMBULATORY_CARE_PROVIDER_SITE_OTHER): Payer: PRIVATE HEALTH INSURANCE | Admitting: Physician Assistant

## 2024-05-03 VITALS — BP 142/88 | HR 114 | Temp 98.5°F | Ht 69.0 in | Wt 233.0 lb

## 2024-05-03 DIAGNOSIS — I861 Scrotal varices: Secondary | ICD-10-CM | POA: Diagnosis not present

## 2024-05-03 DIAGNOSIS — F909 Attention-deficit hyperactivity disorder, unspecified type: Secondary | ICD-10-CM | POA: Diagnosis not present

## 2024-05-03 DIAGNOSIS — R29818 Other symptoms and signs involving the nervous system: Secondary | ICD-10-CM | POA: Diagnosis not present

## 2024-05-03 DIAGNOSIS — Z79899 Other long term (current) drug therapy: Secondary | ICD-10-CM | POA: Diagnosis not present

## 2024-05-03 DIAGNOSIS — R0981 Nasal congestion: Secondary | ICD-10-CM

## 2024-05-03 NOTE — Assessment & Plan Note (Addendum)
 Doing well with Adderall, continue at the current dose. Follow-up in 3 months, option virtual.  A little too soon for refill today.

## 2024-05-03 NOTE — Progress Notes (Signed)
 Date:  05/03/2024   Name:  Jared [name]   DOB:  02/25/1991   MRN:  [id number]   Chief Complaint: Medical Management of Chronic Issues  HPI Jared [name] returns to clinic for 76-month follow-up on chronic conditions, namely ADHD for which he is using Adderall with good efficacy, tolerance, and compliance.     In May he was referred to [facility] for sleep study but was out of network.  Over the summer he was between jobs/insurance, but now has [place] and would like to pursue sleep study for suspected OSA.    He is on testosterone  replacement therapy with Dr. Francisca, also has varicocele noted on last physical, plans to bring this up with urology soon.   Endorses sinus congestion for the last 4 days or so, otherwise does not have any URI symptoms.  Has been using behind-the-counter pseudoephedrine for this with limited improvement.  Medication list has been reviewed and updated.  Current Meds  Medication Sig   amphetamine -dextroamphetamine  (ADDERALL) 10 MG tablet Take 1 tablet (10 mg total) by mouth 2 (two) times daily.   NEEDLE, DISP, 18 G (BD SAFETYGLIDE NEEDLE) 18G X 1-1/2 MISC Use as directed   SYRINGE-NEEDLE, DISP, 3 ML (B-D 3CC LUER-LOK SYR 23GX1-1/2) 23G X 1-1/2 3 ML MISC Use as directed   testosterone  enanthate (DELATESTRYL ) 200 MG/ML injection Inject 0.5 mLs (100 mg total) into the muscle once a week.     Review of Systems  Patient Active Problem List   Diagnosis Date Noted   Suspected sleep apnea 02/02/2024   Long-term current use of stimulant 10/31/2023   Varicocele 10/31/2023   Attention deficit hyperactivity disorder (ADHD) 07/15/2023   Class 2 obesity 07/15/2023   History of iron deficiency anemia 07/15/2023   Hyperlipidemia 07/15/2023   Hypogonadism in male 07/15/2023    Allergies  Allergen Reactions   Strattera [Atomoxetine] Rash    Immunization History  Administered Date(s) Administered   DTaP 09/25/1990, 01/20/1991, 03/29/1991, 01/05/1992, 01/16/1996   HIB,  Unspecified 09/25/1990, 01/20/1991, 03/29/1991, 10/08/1991   Hepatitis A, Adult 05/12/2008, 06/09/2008   Hepatitis B, ADULT 06/15/2002, 07/20/2002, 12/31/2002   IPV 09/25/1990, 01/20/1991, 01/05/1992, 01/16/1996   Influenza, Seasonal, Injecte, Preservative Fre 06/09/2008   Influenza,inj,Quad PF,6+ Mos 06/12/2023   MMR 10/08/1991, 01/16/1996   Meningococcal Conjugate 12/31/2007   Tdap 05/12/2008, 10/31/2023    History reviewed. No pertinent surgical history.  Social History   Tobacco Use   Smoking status: Never    Passive exposure: Never   Smokeless tobacco: Never  Vaping Use   Vaping status: Never Used  Substance Use Topics   Alcohol use: Yes    Comment: socially   Drug use: Never    Family History  Problem Relation Age of Onset   Hypertension Mother    Hypertension Father         05/03/2024    9:16 AM 02/02/2024    8:58 AM 07/15/2023   10:20 AM  GAD 7 : Generalized Anxiety Score  Nervous, Anxious, on Edge 0 0 1  Control/stop worrying 0 0 2  Worry too much - different things 0 0 1  Trouble relaxing 0 0 0  Restless 0 0 2  Easily annoyed or irritable 0 0 0  Afraid - awful might happen 0 0 0  Total GAD 7 Score 0 0 6  Anxiety Difficulty Not difficult at all Not difficult at all Not difficult at all       05/03/2024    9:15 AM 02/02/2024  8:57 AM 07/15/2023   10:20 AM  Depression screen PHQ 2/9  Decreased Interest 0 0 3  Down, Depressed, Hopeless 0 0 1  PHQ - 2 Score 0 0 4  Altered sleeping   0  Tired, decreased energy   0  Change in appetite   3  Feeling bad or failure about yourself    2  Trouble concentrating   3  Moving slowly or fidgety/restless   0  Suicidal thoughts   0  PHQ-9 Score   12  Difficult doing work/chores   Somewhat difficult    BP Readings from Last 3 Encounters:  05/03/24 (!) 142/88  10/31/23 114/78  07/15/23 120/80    Wt Readings from Last 3 Encounters:  05/03/24 233 lb (105.7 kg)  10/31/23 226 lb (102.5 kg)  07/15/23 231 lb  (104.8 kg)    BP (!) 142/88 (Cuff Size: Large)   Pulse (!) 114   Temp 98.5 F (36.9 C)   Ht 5' 9 (1.753 m)   Wt 233 lb (105.7 kg)   SpO2 98%   BMI 34.41 kg/m   Physical Exam Vitals and nursing note reviewed.  Constitutional:      Appearance: Normal appearance.  Cardiovascular:     Rate and Rhythm: Normal rate and regular rhythm.     Heart sounds: No murmur heard.    No friction rub. No gallop.  Pulmonary:     Effort: Pulmonary effort is normal.     Breath sounds: Normal breath sounds.  Abdominal:     General: There is no distension.  Musculoskeletal:        General: Normal range of motion.  Skin:    General: Skin is warm and dry.  Neurological:     Mental Status: He is alert and oriented to person, place, and time.     Gait: Gait is intact.  Psychiatric:        Mood and Affect: Mood and affect normal.     Recent Labs     Component Value Date/Time   NA 140 11/17/2023 1018   K 4.3 11/17/2023 1018   CL 101 11/17/2023 1018   CO2 21 11/17/2023 1018   GLUCOSE 74 11/17/2023 1018   BUN 9 11/17/2023 1018   CREATININE 1.09 11/17/2023 1018   CALCIUM 9.6 11/17/2023 1018   PROT 6.9 11/17/2023 1018   ALBUMIN 4.7 11/17/2023 1018   AST 33 11/17/2023 1018   ALT 51 (H) 11/17/2023 1018   ALKPHOS 60 11/17/2023 1018   BILITOT 0.6 11/17/2023 1018    Lab Results  Component Value Date   WBC 8.4 11/17/2023   HGB 18.4 (H) 11/17/2023   HCT 50.4 11/17/2023   MCV 91 11/17/2023   PLT 239 11/17/2023   No results found for: HGBA1C Lab Results  Component Value Date   CHOL 170 11/17/2023   HDL 34 (L) 11/17/2023   LDLCALC 91 11/17/2023   TRIG 270 (H) 11/17/2023   CHOLHDL 5.0 11/17/2023   Lab Results  Component Value Date   TSH 1.340 11/17/2023      Assessment and Plan:  Attention deficit hyperactivity disorder (ADHD), unspecified ADHD type Assessment & Plan: Doing well with Adderall, continue at the current dose. Follow-up in 3 months, option virtual.  A little too  soon for refill today.  Orders: -     Drug Screen, Urine  Long-term current use of stimulant Assessment & Plan: Check UDS  Orders: -     Drug Screen, Urine  Suspected  sleep apnea Assessment & Plan: Resubmitting referral for sleep study through [facility] now that he has [place].    Varicocele Assessment & Plan: Patient encouraged to bring to the attention of urology, as some testicular atrophy was noted last physical.  This is a bit of a chicken or egg situation as far as whether the atrophy came from the varicocele thereby causing the testosterone  deficiency, or if the atrophy is a result of exogenous TRT.  He will reach out to them.   Nasal congestion Patient encouraged to try OTC Afrin nasal spray for the next 2 to 3 days to open up the airways and allow him to sleep better.  Discussed not to use longer than that.  Could also try nondrowsy oral antihistamine such as Allegra.    Return in about 3 months (around 08/02/2024) for Virtual OV ADHD.    Rolan Hoyle, PA-C, DMSc, Nutritionist [facility] [phone]

## 2024-05-03 NOTE — Assessment & Plan Note (Signed)
 Resubmitting referral for sleep study through [facility] now that he has [place].

## 2024-05-03 NOTE — Assessment & Plan Note (Signed)
 Patient encouraged to bring to the attention of urology, as some testicular atrophy was noted last physical.  This is a bit of a chicken or egg situation as far as whether the atrophy came from the varicocele thereby causing the testosterone  deficiency, or if the atrophy is a result of exogenous TRT.  He will reach out to them.

## 2024-05-03 NOTE — Patient Instructions (Signed)

## 2024-05-03 NOTE — Assessment & Plan Note (Signed)
 -  Check UDS

## 2024-05-05 LAB — DRUG SCREEN, URINE
Amphetamines, Urine: NEGATIVE ng/mL
Barbiturate screen, urine: NEGATIVE ng/mL
Benzodiazepine Quant, Ur: NEGATIVE ng/mL
Cannabinoid Quant, Ur: NEGATIVE ng/mL
Cocaine (Metab.): NEGATIVE ng/mL
Opiate Quant, Ur: NEGATIVE ng/mL
PCP Quant, Ur: NEGATIVE ng/mL

## 2024-05-17 ENCOUNTER — Other Ambulatory Visit: Payer: Self-pay | Admitting: Physician Assistant

## 2024-05-17 DIAGNOSIS — F909 Attention-deficit hyperactivity disorder, unspecified type: Secondary | ICD-10-CM

## 2024-05-17 MED ORDER — AMPHETAMINE-DEXTROAMPHETAMINE 10 MG PO TABS: 10.0000 mg | ORAL_TABLET | Freq: Two times a day (BID) | ORAL | 0 refills | Status: DC

## 2024-05-17 NOTE — Telephone Encounter (Signed)
 Please review.  KP

## 2024-06-14 ENCOUNTER — Other Ambulatory Visit: Payer: Self-pay | Admitting: Physician Assistant

## 2024-06-14 DIAGNOSIS — F909 Attention-deficit hyperactivity disorder, unspecified type: Secondary | ICD-10-CM

## 2024-06-14 MED ORDER — AMPHETAMINE-DEXTROAMPHETAMINE 10 MG PO TABS: 10.0000 mg | ORAL_TABLET | Freq: Two times a day (BID) | ORAL | 0 refills | Status: DC

## 2024-06-14 NOTE — Telephone Encounter (Signed)
 Done

## 2024-06-14 NOTE — Telephone Encounter (Signed)
 Please review.  KP

## 2024-07-13 ENCOUNTER — Other Ambulatory Visit: Payer: Self-pay | Admitting: Urology

## 2024-07-13 DIAGNOSIS — E291 Testicular hypofunction: Secondary | ICD-10-CM

## 2024-07-14 ENCOUNTER — Other Ambulatory Visit: Payer: Self-pay | Admitting: Physician Assistant

## 2024-07-14 DIAGNOSIS — F909 Attention-deficit hyperactivity disorder, unspecified type: Secondary | ICD-10-CM

## 2024-07-14 MED ORDER — AMPHETAMINE-DEXTROAMPHETAMINE 10 MG PO TABS: 10.0000 mg | ORAL_TABLET | Freq: Two times a day (BID) | ORAL | 0 refills | Status: DC

## 2024-07-14 NOTE — Telephone Encounter (Signed)
 Please review.  KP

## 2024-07-30 ENCOUNTER — Other Ambulatory Visit

## 2024-07-31 NOTE — Progress Notes (Deleted)
 08/03/2024 9:42 PM   Court Romelle November 04, 1990 [id number]  Referring provider: Manya Toribio SQUIBB, PA [address] [place],  [place] [address]  Urological history: 1. Hypogonadism - testosterone  level pending - hemoglobin/hematocrit pending - testosterone  enanthate 200 mg/mL, 0.5 cc every 7 days  No chief complaint on file.  HPI: Patton Rabinovich is a 33 y.o. man who presents today for 6 month follow up.    Previous records reviewed.   He reports good adherence to testosterone  enanthate 200 mg/mL, 0.5 cc every 7 days.  Denies new complaints of low libido, erectile dysfunction, fatigue, or mood changes.  No complaints of gynecomastia, visual changes, or thromboembolic symptoms.  Energy level, libido and overall sense of wellbeing being reported as stable/ improved compared to prior visit.    Testosterone  level pending   Hemoglobin/hematocrit pending   Liver enzymes (10/2023) elevated ALT  PMH: Past Medical History:  Diagnosis Date   ADHD    Low testosterone      Surgical History: No past surgical history on file.  Home Medications:  Allergies as of 08/03/2024       Reactions   Strattera [atomoxetine] Rash        Medication List        Accurate as of July 31, 2024  9:42 PM. If you have any questions, ask your nurse or doctor.          amphetamine -dextroamphetamine  10 MG tablet Commonly known as: ADDERALL Take 1 tablet (10 mg total) by mouth 2 (two) times daily.   B-D 3CC LUER-LOK SYR 23GX1-1/2 23G X 1-1/2 3 ML Misc Generic drug: SYRINGE-NEEDLE (DISP) 3 ML Use as directed   BD SafetyGlide Needle 18G X 1-1/2 Misc Generic drug: NEEDLE (DISP) 18 G Use as directed   testosterone  enanthate 200 MG/ML injection Commonly known as: DELATESTRYL  Inject 0.5 mLs (100 mg total) into the muscle once a week.        Allergies:  Allergies  Allergen Reactions   Strattera [Atomoxetine] Rash    Family History: Family History  Problem  Relation Age of Onset   Hypertension Mother    Hypertension Father     Social History:  reports that he has never smoked. He has never been exposed to tobacco smoke. He has never used smokeless tobacco. He reports current alcohol use. He reports that he does not use drugs.  ROS: Pertinent ROS in HPI  Physical Exam: There were no vitals taken for this visit.  Constitutional:  Well nourished. Alert and oriented, No acute distress. HEENT: [place] AT, moist mucus membranes.  Trachea midline, no masses. Cardiovascular: No clubbing, cyanosis, or edema. Respiratory: Normal respiratory effort, no increased work of breathing. GI: Abdomen is soft, non tender, non distended, no abdominal masses. Liver and spleen not palpable.  No hernias appreciated.  Stool sample for occult testing is not indicated.   GU: No CVA tenderness.  No bladder fullness or masses.  Patient with circumcised/uncircumcised phallus. ***Foreskin easily retracted***  Urethral meatus is patent.  No penile discharge. No penile lesions or rashes. Scrotum without lesions, cysts, rashes and/or edema.  Testicles are located scrotally bilaterally. No masses are appreciated in the testicles. Left and right epididymis are normal. Rectal: Patient with  normal sphincter tone. Anus and perineum without scarring or rashes. No rectal masses are appreciated. Prostate is approximately *** grams, *** nodules are appreciated. Seminal vesicles are normal. Skin: No rashes, bruises or suspicious lesions. Lymph: No cervical or inguinal adenopathy. Neurologic: Grossly intact, no  focal deficits, moving all 4 extremities. Psychiatric: Normal mood and affect.  Laboratory Data: See Epic and HPI   I have reviewed the labs.   Pertinent Imaging: N/A  Assessment & Plan:  ***  1. Hypogonadism - testosterone  level pending - hemoglobin/hematocrit level pending - continue testosterone  enanthate 200 mg/mL, 0.5 cc every 7 days, will adjust if necessary once  labs are available  No follow-ups on file.  These notes generated with voice recognition software. I apologize for typographical errors.  CLOTILDA HELON RIGGERS  [facility] [address]  [address] [place], [place] [address] [phone]

## 2024-08-02 ENCOUNTER — Telehealth: Admitting: Physician Assistant

## 2024-08-02 ENCOUNTER — Other Ambulatory Visit

## 2024-08-02 ENCOUNTER — Encounter: Payer: Self-pay | Admitting: Physician Assistant

## 2024-08-02 VITALS — Ht 69.0 in

## 2024-08-02 DIAGNOSIS — Z79899 Other long term (current) drug therapy: Secondary | ICD-10-CM | POA: Diagnosis not present

## 2024-08-02 DIAGNOSIS — F909 Attention-deficit hyperactivity disorder, unspecified type: Secondary | ICD-10-CM

## 2024-08-02 DIAGNOSIS — G4733 Obstructive sleep apnea (adult) (pediatric): Secondary | ICD-10-CM | POA: Diagnosis not present

## 2024-08-02 NOTE — Progress Notes (Signed)
 Date:  08/02/2024   Name:  Jared [name]   DOB:  1991-06-11   MRN:  [id number]   I connected with Jared [name] on 08/02/2024 via MyChart Video and verified that I am speaking with the correct Jared [name] using appropriate identifiers. The limitations, risks, security and privacy concerns of performing an evaluation and management service by MyChart Video, including the higher likelihood of inaccurate diagnoses and treatments, and the availability of in [facility] appointments were reviewed. The possible need of an additional face-to-face encounter for complete and high quality delivery of care was discussed. The patient was also made aware that there may be a patient responsible charge related to this service. The patient expressed understanding and wishes to proceed.   Provider location is in medical facility [facility] at [facility]). Patient location is at their home People involved in care of the patient during this telehealth encounter were myself, my CMA, and my front office/scheduling team member.    Chief Complaint: ADHD  HPI   Jared [name] returns virtually for 79-month follow-up on chronic conditions, namely ADHD for which he is using Adderall with good efficacy, tolerance, and compliance.     In May he was referred to [facility] for sleep study and this confirmed severe OSA.  He was started on CPAP roughly 2 months ago and feels more rested.   He was on testosterone  replacement therapy with Dr. Francisca, but has paused this for the moment - needs appointment with them to renew prescription.  He does not need anything in particular from me today.   Medication list has been reviewed and updated.  Current Meds  Medication Sig   amphetamine -dextroamphetamine  (ADDERALL) 10 MG tablet Take 1 tablet (10 mg total) by mouth 2 (two) times daily.   NEEDLE, DISP, 18 G (BD SAFETYGLIDE NEEDLE) 18G X 1-1/2 MISC Use as directed   SYRINGE-NEEDLE, DISP, 3 ML (B-D  3CC LUER-LOK SYR 23GX1-1/2) 23G X 1-1/2 3 ML MISC Use as directed   testosterone  enanthate (DELATESTRYL ) 200 MG/ML injection Inject 0.5 mLs (100 mg total) into the muscle once a week.   UNABLE TO FIND Med Name: CPAP     Review of Systems  Patient Active Problem List   Diagnosis Date Noted   Severe OSA on CPAP 02/02/2024   Long-term current use of stimulant 10/31/2023   Varicocele 10/31/2023   Attention deficit hyperactivity disorder (ADHD) 07/15/2023   Class 2 obesity 07/15/2023   History of iron deficiency anemia 07/15/2023   Hyperlipidemia 07/15/2023   Hypogonadism in male 07/15/2023    Allergies  Allergen Reactions   Strattera [Atomoxetine] Rash    Immunization History  Administered Date(s) Administered   DTaP 09/25/1990, 01/20/1991, 03/29/1991, 01/05/1992, 01/16/1996   HIB, Unspecified 09/25/1990, 01/20/1991, 03/29/1991, 10/08/1991   Hepatitis A, Adult 05/12/2008, 06/09/2008   Hepatitis B, ADULT 06/15/2002, 07/20/2002, 12/31/2002   IPV 09/25/1990, 01/20/1991, 01/05/1992, 01/16/1996   Influenza, Seasonal, Injecte, Preservative Fre 06/09/2008   Influenza,inj,Quad PF,6+ Mos 06/12/2023   MMR 10/08/1991, 01/16/1996   Meningococcal Conjugate 12/31/2007   Tdap 05/12/2008, 10/31/2023    History reviewed. No pertinent surgical history.  Social History   Tobacco Use   Smoking status: Never    Passive exposure: Never   Smokeless tobacco: Never  Vaping Use   Vaping status: Never Used  Substance Use Topics   Alcohol use: Yes    Comment: socially   Drug use: Never    Family History  Problem Relation Age of Onset  Hypertension Mother    Hypertension Father         08/02/2024    8:59 AM 05/03/2024    9:16 AM 02/02/2024    8:58 AM 07/15/2023   10:20 AM  GAD 7 : Generalized Anxiety Score  Nervous, Anxious, on Edge 0 0 0 1  Control/stop worrying 0 0 0 2  Worry too much - different things 0 0 0 1  Trouble relaxing 0 0 0 0  Restless 0 0 0 2  Easily annoyed or  irritable 0 0 0 0  Afraid - awful might happen 0 0 0 0  Total GAD 7 Score 0 0 0 6  Anxiety Difficulty Not difficult at all Not difficult at all Not difficult at all Not difficult at all       08/02/2024    8:59 AM 05/03/2024    9:15 AM 02/02/2024    8:57 AM  Depression screen PHQ 2/9  Decreased Interest 0 0 0  Down, Depressed, Hopeless 0 0 0  PHQ - 2 Score 0 0 0    BP Readings from Last 3 Encounters:  05/03/24 (!) 142/88  10/31/23 114/78  07/15/23 120/80    Wt Readings from Last 3 Encounters:  05/03/24 233 lb (105.7 kg)  10/31/23 226 lb (102.5 kg)  07/15/23 231 lb (104.8 kg)    Ht 5' 9 (1.753 m)   BMI 34.41 kg/m   Physical Exam General: Speaking full sentences, no audible heavy breathing. Sounds alert and appropriately interactive. Well-appearing. Face symmetric. Extraocular movements intact. Pupils equal and round. No nasal flaring or accessory muscle use visualized.  Recent Labs     Component Value Date/Time   NA 140 11/17/2023 1018   K 4.3 11/17/2023 1018   CL 101 11/17/2023 1018   CO2 21 11/17/2023 1018   GLUCOSE 74 11/17/2023 1018   BUN 9 11/17/2023 1018   CREATININE 1.09 11/17/2023 1018   CALCIUM 9.6 11/17/2023 1018   PROT 6.9 11/17/2023 1018   ALBUMIN 4.7 11/17/2023 1018   AST 33 11/17/2023 1018   ALT 51 (H) 11/17/2023 1018   ALKPHOS 60 11/17/2023 1018   BILITOT 0.6 11/17/2023 1018    Lab Results  Component Value Date   WBC 8.4 11/17/2023   HGB 18.4 (H) 11/17/2023   HCT 50.4 11/17/2023   MCV 91 11/17/2023   PLT 239 11/17/2023   No results found for: HGBA1C Lab Results  Component Value Date   CHOL 170 11/17/2023   HDL 34 (L) 11/17/2023   LDLCALC 91 11/17/2023   TRIG 270 (H) 11/17/2023   CHOLHDL 5.0 11/17/2023   Lab Results  Component Value Date   TSH 1.340 11/17/2023     Assessment and Plan:  Attention deficit hyperactivity disorder (ADHD), unspecified ADHD type Assessment & Plan: Doing well with Adderall, continue at the current  dose. Follow-up in 3 months in-Jared [name]   Long-term current use of stimulant Assessment & Plan: Continue Adderall. UDS next time.   Severe OSA on CPAP Assessment & Plan: Continue CPAP therapy through specialist.       F/u 39mo CPE. UDS for Adderall  I discussed the above assessment and treatment plan with the patient. The patient was provided an opportunity to ask questions and all were answered. The patient agreed with the plan and demonstrated an understanding of the instructions. The patient was advised to call back or seek an in-[facility] evaluation if the symptoms worsen or if the condition fails to improve as anticipated. I provided  a total time of 15 minutes inclusive of time utilized for medical chart review, information gathering, care coordination with staff, and documentation completion.  Rolan Hoyle, PA-C, DMSc, Nutritionist [facility] [phone]

## 2024-08-02 NOTE — Assessment & Plan Note (Signed)
 Doing well with Adderall, continue at the current dose. Follow-up in 3 months in-person

## 2024-08-02 NOTE — Assessment & Plan Note (Signed)
 Continue Adderall. UDS next time.

## 2024-08-02 NOTE — Assessment & Plan Note (Signed)
 Continue CPAP therapy through specialist.

## 2024-08-03 ENCOUNTER — Ambulatory Visit: Admitting: Urology

## 2024-08-03 ENCOUNTER — Other Ambulatory Visit

## 2024-08-03 DIAGNOSIS — E291 Testicular hypofunction: Secondary | ICD-10-CM

## 2024-08-05 ENCOUNTER — Other Ambulatory Visit
Admission: RE | Admit: 2024-08-05 | Discharge: 2024-08-05 | Disposition: A | Discharge status: Home / Self Care | Attending: Urology | Admitting: Urology

## 2024-08-05 ENCOUNTER — Other Ambulatory Visit

## 2024-08-05 DIAGNOSIS — E291 Testicular hypofunction: Secondary | ICD-10-CM | POA: Diagnosis present

## 2024-08-05 LAB — HEMOGLOBIN AND HEMATOCRIT, BLOOD
HCT: 46.6 % (ref 39.0–52.0)
Hemoglobin: 16.7 g/dL (ref 13.0–17.0)

## 2024-08-06 ENCOUNTER — Other Ambulatory Visit

## 2024-08-06 LAB — TESTOSTERONE: Testosterone: 105 ng/dL — ABNORMAL LOW (ref 264–916)

## 2024-08-09 NOTE — Progress Notes (Deleted)
 08/13/2024 11:05 AM   Jared [name] Aug 11, 1991 [id number]  Referring provider: Manya Toribio SQUIBB, PA [address] [place],  [place] [address]  Urological history: 1. Hypogonadism - testosterone  level (07/2024) 105 - hemoglobin/hematocrit (07/2024) 16.7/46.6 - testosterone  enanthate 200 mg/mL, 0.5 cc every 7 days  CC: Hypogonadism   HPI: Jared [name] is a 33 y.o. man who presents today for 6 month follow up.    Previous records reviewed.   He reports good adherence to testosterone  enanthate 200 mg/mL, 0.5 cc every 7 days.  Denies new complaints of low libido, erectile dysfunction, fatigue, or mood changes.  No complaints of gynecomastia, visual changes, or thromboembolic symptoms.  Energy level, libido and overall sense of wellbeing being reported as stable/ improved compared to prior visit.    Testosterone  level 105  Hemoglobin/hematocrit 16.7/46.6  Liver enzymes (10/2023) elevated ALT  PMH: Past Medical History:  Diagnosis Date   ADHD    Low testosterone      Surgical History: No past surgical history on file.  Home Medications:  Allergies as of 08/13/2024       Reactions   Strattera [atomoxetine] Rash        Medication List        Accurate as of August 09, 2024 11:05 AM. If you have any questions, ask your nurse or doctor.          amphetamine -dextroamphetamine  10 MG tablet Commonly known as: ADDERALL Take 1 tablet (10 mg total) by mouth 2 (two) times daily.   B-D 3CC LUER-LOK SYR 23GX1-1/2 23G X 1-1/2 3 ML Misc Generic drug: SYRINGE-NEEDLE (DISP) 3 ML Use as directed   BD SafetyGlide Needle 18G X 1-1/2 Misc Generic drug: NEEDLE (DISP) 18 G Use as directed   testosterone  enanthate 200 MG/ML injection Commonly known as: DELATESTRYL  Inject 0.5 mLs (100 mg total) into the muscle once a week.   UNABLE TO FIND Med Name: CPAP        Allergies:  Allergies  Allergen Reactions   Strattera [Atomoxetine] Rash    Family  History: Family History  Problem Relation Age of Onset   Hypertension Mother    Hypertension Father     Social History:  reports that he has never smoked. He has never been exposed to tobacco smoke. He has never used smokeless tobacco. He reports current alcohol use. He reports that he does not use drugs.  ROS: Pertinent ROS in HPI  Physical Exam: There were no vitals taken for this visit.  Constitutional:  Well nourished. Alert and oriented, No acute distress. HEENT: [place] AT, moist mucus membranes.  Trachea midline, no masses. Cardiovascular: No clubbing, cyanosis, or edema. Respiratory: Normal respiratory effort, no increased work of breathing. GI: Abdomen is soft, non tender, non distended, no abdominal masses. Liver and spleen not palpable.  No hernias appreciated.  Stool sample for occult testing is not indicated.   GU: No CVA tenderness.  No bladder fullness or masses.  Patient with circumcised/uncircumcised phallus. ***Foreskin easily retracted***  Urethral meatus is patent.  No penile discharge. No penile lesions or rashes. Scrotum without lesions, cysts, rashes and/or edema.  Testicles are located scrotally bilaterally. No masses are appreciated in the testicles. Left and right epididymis are normal. Rectal: Patient with  normal sphincter tone. Anus and perineum without scarring or rashes. No rectal masses are appreciated. Prostate is approximately *** grams, *** nodules are appreciated. Seminal vesicles are normal. Skin: No rashes, bruises or suspicious lesions. Lymph: No cervical or inguinal  adenopathy. Neurologic: Grossly intact, no focal deficits, moving all 4 extremities. Psychiatric: Normal mood and affect.  Laboratory Data: See Epic and HPI   I have reviewed the labs.   Pertinent Imaging: N/A  Assessment & Plan:  ***  1. Hypogonadism - testosterone  level subtherapeutic - hemoglobin/hematocrit level normal  - continue testosterone  enanthate 200 mg/mL, 0.5 cc every  7 days, will adjust if necessary once labs are available  No follow-ups on file.  These notes generated with voice recognition software. I apologize for typographical errors.  Jared [name]  [facility] [address]  [address] [place], [place] [address] [phone]

## 2024-08-13 ENCOUNTER — Ambulatory Visit: Admitting: Urology

## 2024-08-16 ENCOUNTER — Other Ambulatory Visit: Payer: Self-pay | Admitting: Physician Assistant

## 2024-08-16 DIAGNOSIS — F909 Attention-deficit hyperactivity disorder, unspecified type: Secondary | ICD-10-CM

## 2024-08-16 MED ORDER — AMPHETAMINE-DEXTROAMPHETAMINE 10 MG PO TABS: 10.0000 mg | ORAL_TABLET | Freq: Two times a day (BID) | ORAL | 0 refills | Status: DC

## 2024-08-16 NOTE — Telephone Encounter (Signed)
 Please review.  KP

## 2024-09-05 NOTE — Progress Notes (Unsigned)
 "    09/06/2024 6:27 PM   Jared [name] [birth year] [id number]  Referring provider: Manya Toribio SQUIBB, PA [address] [place],  [place] [address]  Urological history: 1. Hypogonadism - testosterone  level pending  - hemoglobin/hematocrit pending  - testosterone  enanthate 200 mg/mL, 0.5 cc every 7 days  CC: Hypogonadism   HPI: Jared [name] is a 34 y.o. man who presents today for follow up.    Previous records reviewed.   He reports good adherence to ***testosterone  enanthate 200 mg/mL, 0.5 cc every 7 days.  Denies new complaints of low libido, erectile dysfunction, fatigue, or mood changes.  No complaints of gynecomastia, visual changes, or thromboembolic symptoms.  Energy level, libido and overall sense of wellbeing being reported as stable/ improved compared to prior visit.  Labs pending.     PMH: Past Medical History:  Diagnosis Date   ADHD    Low testosterone      Surgical History: No past surgical history on file.  Home Medications:  Allergies as of 09/06/2024       Reactions   Strattera [atomoxetine] Rash        Medication List        Accurate as of September 05, 2024  6:27 PM. If you have any questions, ask your nurse or doctor.          amphetamine -dextroamphetamine  10 MG tablet Commonly known as: ADDERALL Take 1 tablet (10 mg total) by mouth 2 (two) times daily.   B-D 3CC LUER-LOK SYR 23GX1-1/2 23G X 1-1/2 3 ML Misc Generic drug: SYRINGE-NEEDLE (DISP) 3 ML Use as directed   BD SafetyGlide Needle 18G X 1-1/2 Misc Generic drug: NEEDLE (DISP) 18 G Use as directed   testosterone  enanthate 200 MG/ML injection Commonly known as: DELATESTRYL  Inject 0.5 mLs (100 mg total) into the muscle once a week.   UNABLE TO FIND Med Name: CPAP        Allergies:  Allergies  Allergen Reactions   Strattera [Atomoxetine] Rash    Family History: Family History  Problem Relation Age of Onset   Hypertension Mother    Hypertension Father      Social History:  reports that he has never smoked. He has never been exposed to tobacco smoke. He has never used smokeless tobacco. He reports current alcohol use. He reports that he does not use drugs.  ROS: Pertinent ROS in HPI  Physical Exam: There were no vitals taken for this visit.  Constitutional:  Well nourished. Alert and oriented, No acute distress. HEENT: [place] AT, moist mucus membranes.  Trachea midline, no masses. Cardiovascular: No clubbing, cyanosis, or edema. Respiratory: Normal respiratory effort, no increased work of breathing. GI: Abdomen is soft, non tender, non distended, no abdominal masses. Liver and spleen not palpable.  No hernias appreciated.  Stool sample for occult testing is not indicated.   GU: No CVA tenderness.  No bladder fullness or masses.  Patient with circumcised/uncircumcised phallus. ***Foreskin easily retracted***  Urethral meatus is patent.  No penile discharge. No penile lesions or rashes. Scrotum without lesions, cysts, rashes and/or edema.  Testicles are located scrotally bilaterally. No masses are appreciated in the testicles. Left and right epididymis are normal. Rectal: Patient with  normal sphincter tone. Anus and perineum without scarring or rashes. No rectal masses are appreciated. Prostate is approximately *** grams, *** nodules are appreciated. Seminal vesicles are normal. Skin: No rashes, bruises or suspicious lesions. Lymph: No cervical or inguinal adenopathy. Neurologic: Grossly intact, no focal deficits, moving  all 4 extremities. Psychiatric: Normal mood and affect.  Laboratory Data: See Epic and HPI   I have reviewed the labs.   Pertinent Imaging: N/A  Assessment & Plan:  ***  1. Hypogonadism - testosterone  level subtherapeutic - hemoglobin/hematocrit level normal  - continue testosterone  enanthate 200 mg/mL, 0.5 cc every 7 days, will adjust if necessary once labs are available  No follow-ups on file.  These notes  generated with voice recognition software. I apologize for typographical errors.  CLOTILDA HELON RIGGERS  [facility] [address]  [address] [place], [place] [address] [phone]  "

## 2024-09-06 ENCOUNTER — Telehealth: Payer: Self-pay

## 2024-09-06 ENCOUNTER — Ambulatory Visit: Admitting: Urology

## 2024-09-06 NOTE — Telephone Encounter (Signed)
 Pt called in and left voicemail and stated he had a fever and needed to reschedule his appt for this afternoon. Returned phone call in attempt to reschedule and no answer and voicemail is full.

## 2024-09-13 ENCOUNTER — Other Ambulatory Visit: Payer: Self-pay | Admitting: Physician Assistant

## 2024-09-13 DIAGNOSIS — F909 Attention-deficit hyperactivity disorder, unspecified type: Secondary | ICD-10-CM

## 2024-09-13 MED ORDER — AMPHETAMINE-DEXTROAMPHETAMINE 10 MG PO TABS: 10.0000 mg | ORAL_TABLET | Freq: Two times a day (BID) | ORAL | 0 refills | Status: AC

## 2024-09-13 NOTE — Telephone Encounter (Signed)
 Please review.  KP

## 2024-09-29 ENCOUNTER — Ambulatory Visit: Admitting: Urology

## 2024-10-01 NOTE — Progress Notes (Unsigned)
 "    10/04/2024 10:27 AM   Jared [name] [birth year] [id number]  Referring provider: Manya Toribio SQUIBB, PA [address] [place],  [place] [address]  Urological history: 1. Hypogonadism - testosterone  level pending  - hemoglobin/hematocrit pending  - testosterone  enanthate 200 mg/mL, 0.5 cc every 7 days  CC: Hypogonadism   HPI: Jared [name] is a 34 y.o. man who presents today for follow up.    Previous records reviewed.   He reports good adherence to ***testosterone  enanthate 200 mg/mL, 0.5 cc every 7 days.  Denies new complaints of low libido, erectile dysfunction, fatigue, or mood changes.  No complaints of gynecomastia, visual changes, or thromboembolic symptoms.  Energy level, libido and overall sense of wellbeing being reported as stable/ improved compared to prior visit.  Labs pending.     PMH: Past Medical History:  Diagnosis Date   ADHD    Low testosterone      Surgical History: No past surgical history on file.  Home Medications:  Allergies as of 10/04/2024       Reactions   Strattera [atomoxetine] Rash        Medication List        Accurate as of October 01, 2024 10:27 AM. If you have any questions, ask your nurse or doctor.          amphetamine -dextroamphetamine  10 MG tablet Commonly known as: ADDERALL Take 1 tablet (10 mg total) by mouth 2 (two) times daily.   B-D 3CC LUER-LOK SYR 23GX1-1/2 23G X 1-1/2 3 ML Misc Generic drug: SYRINGE-NEEDLE (DISP) 3 ML Use as directed   BD SafetyGlide Needle 18G X 1-1/2 Misc Generic drug: NEEDLE (DISP) 18 G Use as directed   testosterone  enanthate 200 MG/ML injection Commonly known as: DELATESTRYL  Inject 0.5 mLs (100 mg total) into the muscle once a week.   UNABLE TO FIND Med Name: CPAP        Allergies:  Allergies  Allergen Reactions   Strattera [Atomoxetine] Rash    Family History: Family History  Problem Relation Age of Onset   Hypertension Mother    Hypertension Father      Social History:  reports that he has never smoked. He has never been exposed to tobacco smoke. He has never used smokeless tobacco. He reports current alcohol use. He reports that he does not use drugs.  ROS: Pertinent ROS in HPI  Physical Exam: There were no vitals taken for this visit.  Constitutional:  Well nourished. Alert and oriented, No acute distress. HEENT: [place] AT, moist mucus membranes.  Trachea midline, no masses. Cardiovascular: No clubbing, cyanosis, or edema. Respiratory: Normal respiratory effort, no increased work of breathing. GI: Abdomen is soft, non tender, non distended, no abdominal masses. Liver and spleen not palpable.  No hernias appreciated.  Stool sample for occult testing is not indicated.   GU: No CVA tenderness.  No bladder fullness or masses.  Patient with circumcised/uncircumcised phallus. ***Foreskin easily retracted***  Urethral meatus is patent.  No penile discharge. No penile lesions or rashes. Scrotum without lesions, cysts, rashes and/or edema.  Testicles are located scrotally bilaterally. No masses are appreciated in the testicles. Left and right epididymis are normal. Rectal: Patient with  normal sphincter tone. Anus and perineum without scarring or rashes. No rectal masses are appreciated. Prostate is approximately *** grams, *** nodules are appreciated. Seminal vesicles are normal. Skin: No rashes, bruises or suspicious lesions. Lymph: No cervical or inguinal adenopathy. Neurologic: Grossly intact, no focal deficits, moving all  4 extremities. Psychiatric: Normal mood and affect.  Laboratory Data: See Epic and HPI   I have reviewed the labs.   Pertinent Imaging: N/A  Assessment & Plan:  ***  1. Hypogonadism - testosterone  level subtherapeutic - hemoglobin/hematocrit level normal  - continue testosterone  enanthate 200 mg/mL, 0.5 cc every 7 days, will adjust if necessary once labs are available  No follow-ups on file.  These notes  generated with voice recognition software. I apologize for typographical errors.  CLOTILDA HELON RIGGERS  [facility] [address]  [address] [place], [place] [address] [phone]  "

## 2024-10-04 ENCOUNTER — Ambulatory Visit: Admitting: Urology

## 2024-10-04 DIAGNOSIS — E291 Testicular hypofunction: Secondary | ICD-10-CM

## 2024-11-01 ENCOUNTER — Encounter: Admitting: Physician Assistant

## 2024-11-09 ENCOUNTER — Encounter: Admitting: Physician Assistant

## 2024-11-22 ENCOUNTER — Ambulatory Visit: Payer: PRIVATE HEALTH INSURANCE | Admitting: Urology
